# Patient Record
Sex: Male | Born: 2000 | Race: White | Hispanic: No | Marital: Single | Smoking: Never smoker
Health system: Southern US, Community
[De-identification: ages and names within clinical notes are randomized; demographics above are authoritative.]

## PROBLEM LIST (undated history)

## (undated) DIAGNOSIS — J302 Other seasonal allergic rhinitis: Secondary | ICD-10-CM

## (undated) HISTORY — PX: ADENOIDECTOMY: SUR15

## (undated) HISTORY — PX: TONSILLECTOMY: SUR1361

---
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
## 2022-04-16 ENCOUNTER — Ambulatory Visit: Payer: BC Managed Care – PPO | Admitting: Sports Medicine

## 2022-04-22 IMAGING — DX DG HIP (WITH OR WITHOUT PELVIS) 2-3V*L*
3 series · 3 of 3 positions shown · non-contrast
Comparison: None.

CLINICAL DATA: Pain and swelling at left anterior superior iliac
spine after a fall, assess for fracture. Tripped while playing
baseball 06/07/2021 landing on left hip. Pain resolved per turned 3
days ago while playing baseball.

EXAM:
DG HIP (WITH OR WITHOUT PELVIS) 2-3V LEFT

[pelvis ap]
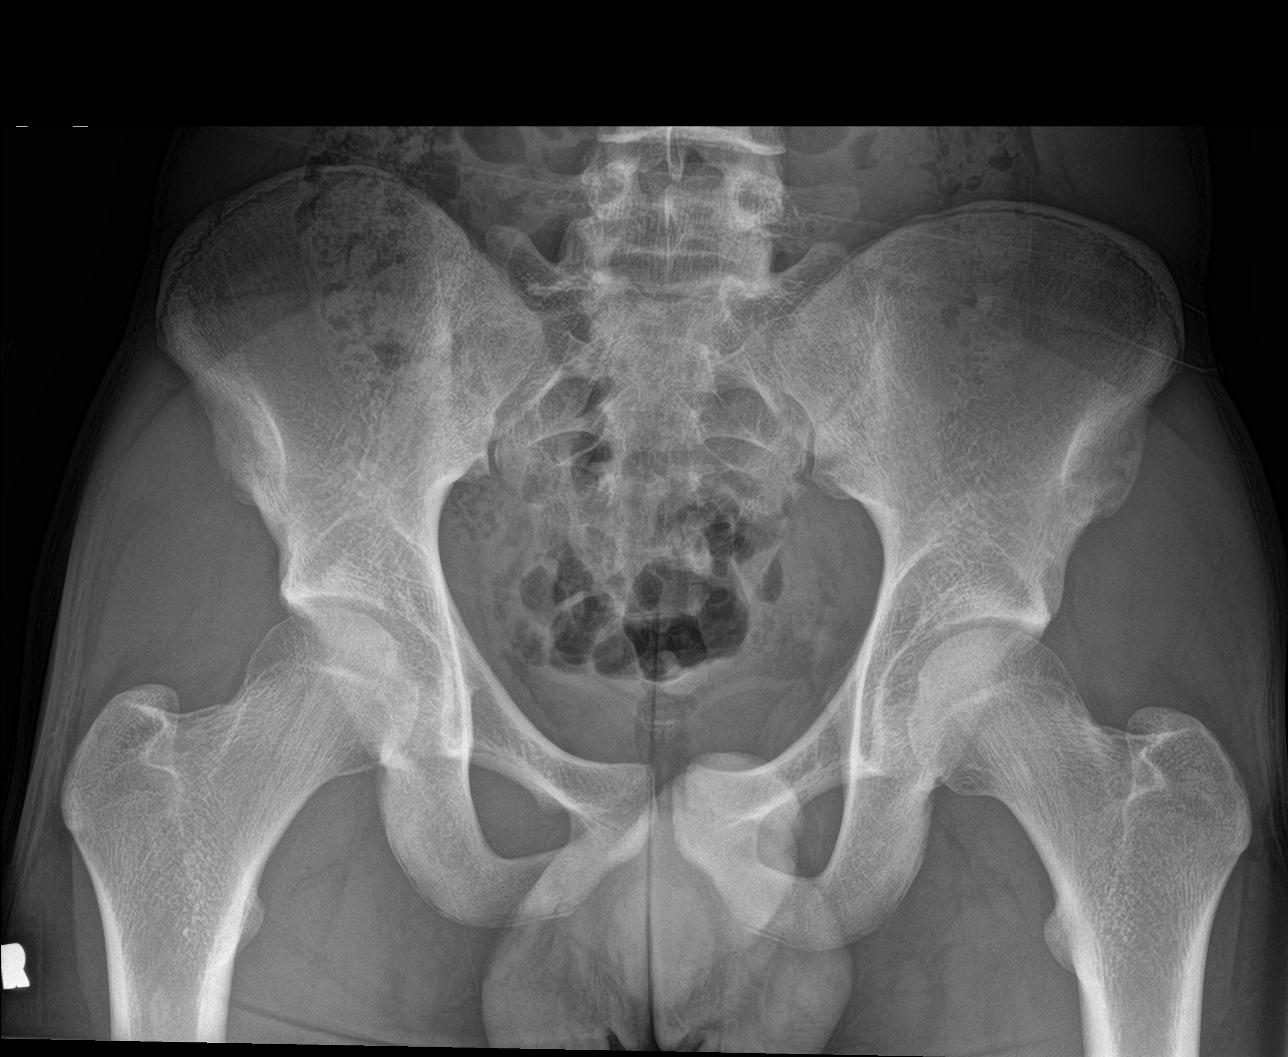

[hip ap]
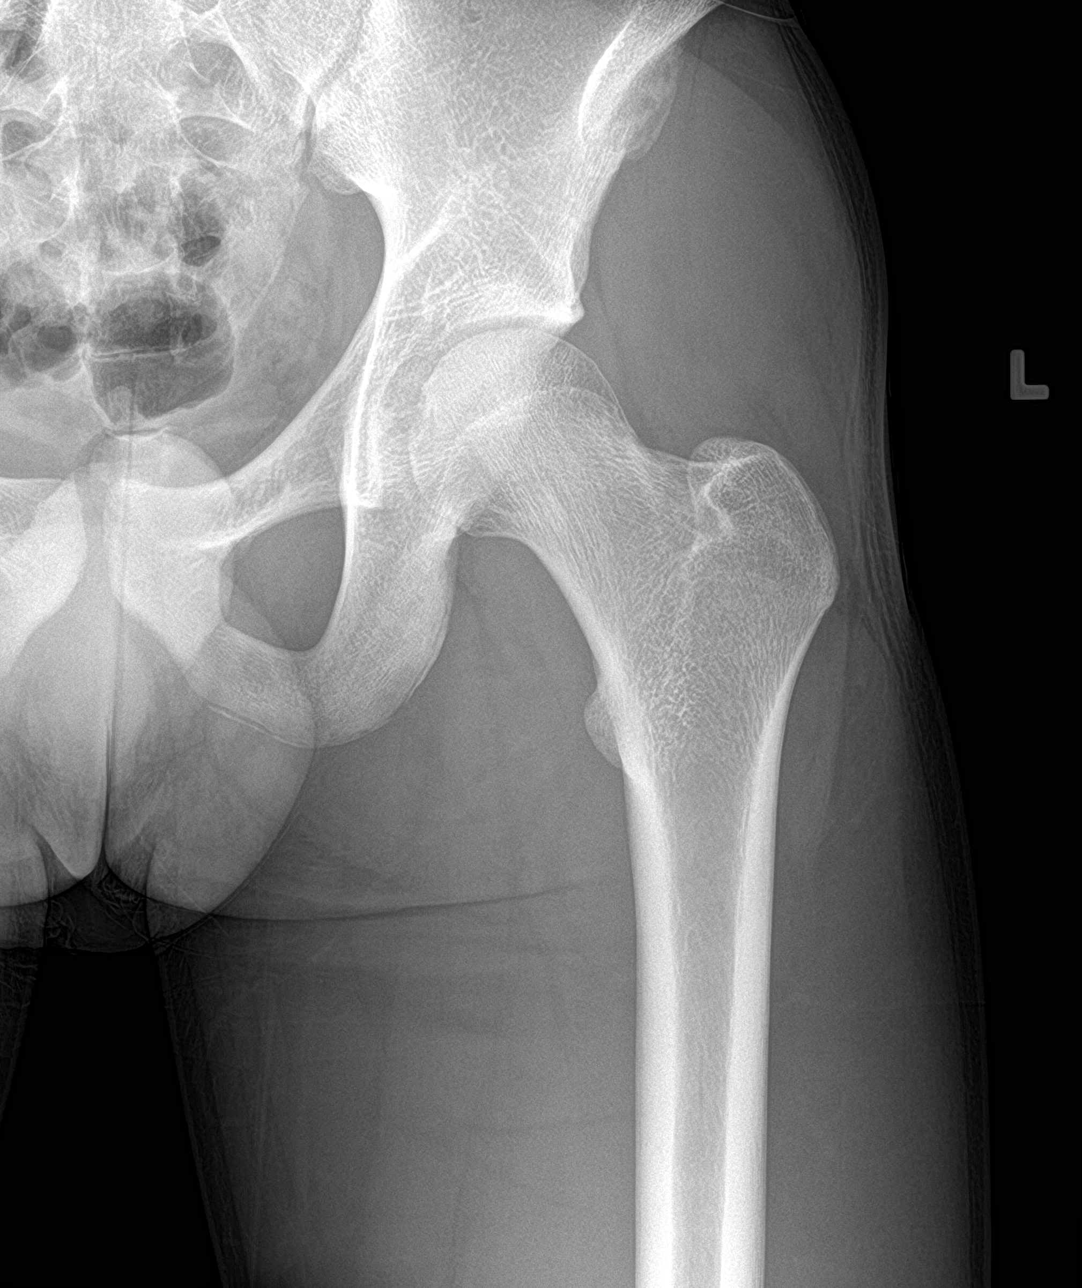

[hip lat]
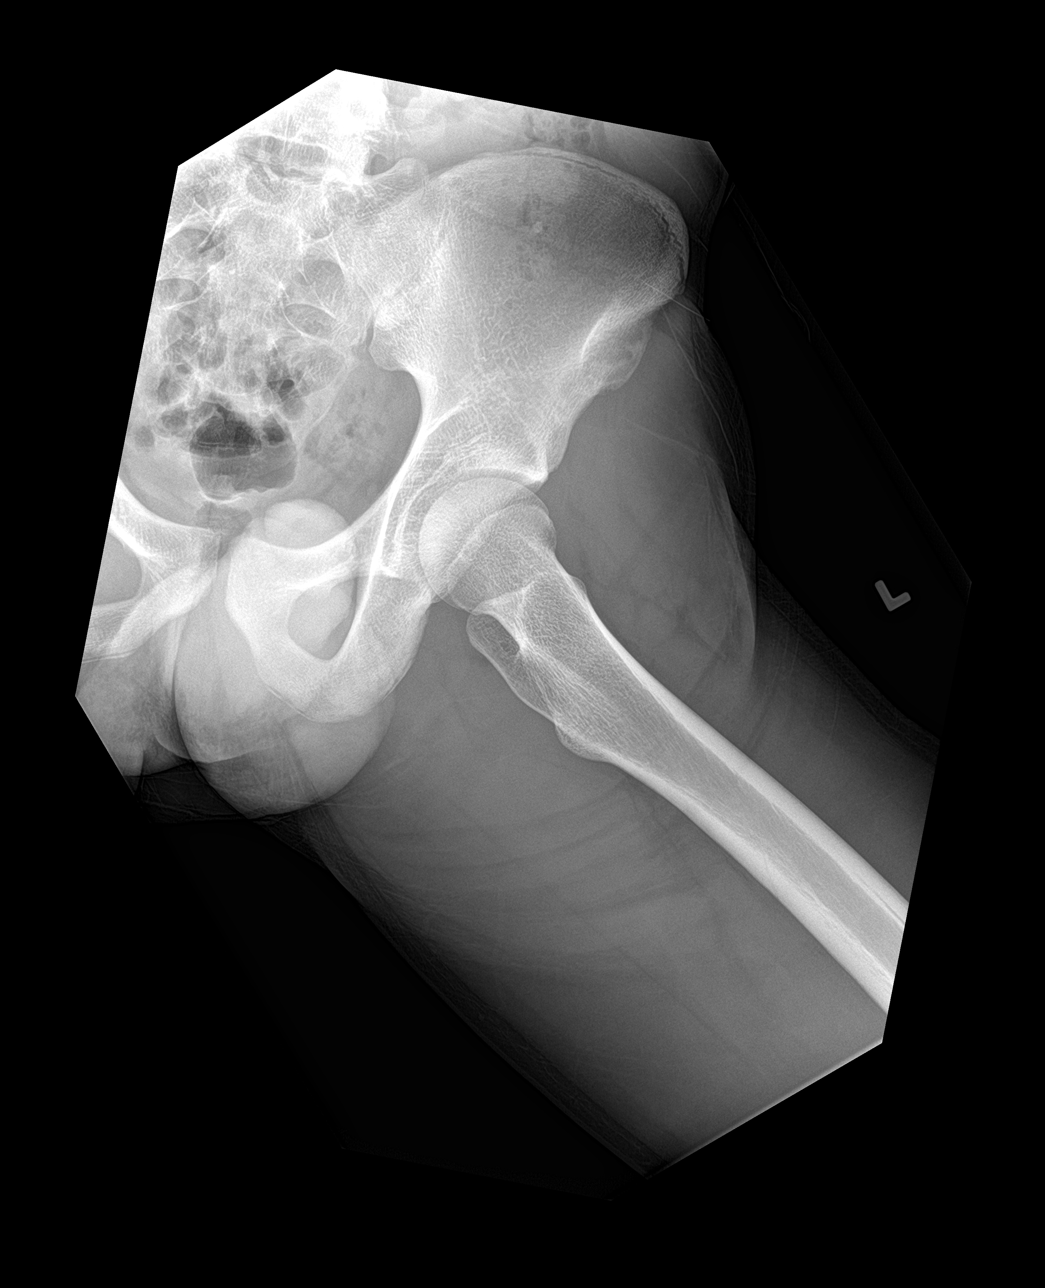

[3 of 3 positions shown; findings below may reference images not displayed]

FINDINGS: No acute fracture. Symmetric appearance of the iliac crest apophyses
without widening or displacement. The anterior superior iliac crest
apophysis ease are fusing, no definite avulsion injury or periosteal
reaction to suggest subacute fracture. Both femoral heads are well
seated in the respective acetabulum. Proximal femoral growth plates
are fused. The left hip joint space is preserved. The pubic rami and
sacroiliac joints are congruent.
IMPRESSION: No radiographic evidence of fracture of the pelvis or left hip. No
radiographic evidence of anterior superior iliac spine avulsion,
with fusing physis. If there is persistent clinical concern for
physeal injury, recommend MRI.

## 2022-06-17 ENCOUNTER — Ambulatory Visit (INDEPENDENT_AMBULATORY_CARE_PROVIDER_SITE_OTHER): Payer: BC Managed Care – PPO | Admitting: Sports Medicine

## 2022-06-17 ENCOUNTER — Encounter: Payer: Self-pay | Admitting: Sports Medicine

## 2022-06-17 VITALS — BP 113/72 | HR 70 | Ht 72.0 in | Wt 154.0 lb

## 2022-06-17 DIAGNOSIS — Z025 Encounter for examination for participation in sport: Secondary | ICD-10-CM

## 2022-06-17 DIAGNOSIS — Z130 Encounter for screening for diseases of the blood and blood-forming organs and certain disorders involving the immune mechanism: Secondary | ICD-10-CM

## 2022-06-17 NOTE — Progress Notes (Signed)
    Procedures performed today:    None.  Independent interpretation of notes and tests performed by another provider:   None.  Brief History, Exam, Impression, and Recommendations:    General: Well Developed, well nourished, and in no acute distress.  Neuro: Alert and oriented x3, extra-ocular muscles intact, sensation grossly intact. Cranial nerves II through XII are intact, motor, sensory, and coordinative functions are all intact. HEENT: Normocephalic, atraumatic, pupils equal round reactive to light, neck supple, no masses, no lymphadenopathy, thyroid nonpalpable. Oropharynx, nasopharynx, external ear canals are unremarkable. Skin: Warm and dry, no rashes noted.  Cardiac: Regular rate and rhythm, no murmurs rubs or gallops.  Respiratory: Clear to auscultation bilaterally. Not using accessory muscles, speaking in full sentences.  Abdominal: Soft, nontender, nondistended, positive bowel sounds, no masses, no organomegaly.  Musculoskeletal: Shoulder, elbow, wrist, hip, knee, ankle stable, and with full range of motion.  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 He will need a sickle test as all college athletes do.    ____________________________________________ Ihor Austin. Benjamin Stain, M.D., ABFM., CAQSM., AME. Primary Care and Sports Medicine [facility]  Adjunct Professor of Family Medicine  [employer]  Restaurant manager, fast food

## 2022-06-17 NOTE — Assessment & Plan Note (Signed)
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 He will need a sickle test as all college athletes do.

## 2022-06-18 LAB — SICKLE CELL SCREEN: Sickle Solubility Test - HGBRFX: NEGATIVE

## 2023-01-28 ENCOUNTER — Ambulatory Visit: Payer: BC Managed Care – PPO | Admitting: Sports Medicine

## 2023-01-30 ENCOUNTER — Ambulatory Visit: Payer: BC Managed Care – PPO | Admitting: Sports Medicine

## 2023-01-30 ENCOUNTER — Encounter: Payer: Self-pay | Admitting: Sports Medicine

## 2023-01-30 ENCOUNTER — Ambulatory Visit (INDEPENDENT_AMBULATORY_CARE_PROVIDER_SITE_OTHER): Payer: BC Managed Care – PPO

## 2023-01-30 DIAGNOSIS — M25521 Pain in right elbow: Secondary | ICD-10-CM

## 2023-01-30 DIAGNOSIS — R590 Localized enlarged lymph nodes: Secondary | ICD-10-CM | POA: Diagnosis not present

## 2023-01-30 MED ORDER — MELOXICAM 15 MG PO TABS: ORAL_TABLET | ORAL | 3 refills | Status: AC

## 2023-01-30 MED ORDER — AZITHROMYCIN 250 MG PO TABS: ORAL_TABLET | ORAL | 0 refills | Status: AC

## 2023-01-30 NOTE — Assessment & Plan Note (Signed)
Barry Hill is a pleasant, previously healthy 22 year old male, he had an upper respiratory infection, then about 3 weeks ago he developed swelling in his neck. All viral symptoms have improved, no constitutional symptoms. On exam he does have bilateral anterior and posterior cervical chain lymphadenopathy, well-defined, movable, nontender. I suspect this is just reactive lymphadenopathy, we will do a course of azithromycin, and I will review evaluate this in 6 weeks. If still present we will proceed with neck and soft tissue ultrasound.

## 2023-01-30 NOTE — Assessment & Plan Note (Signed)
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 For the past several months he had increasing pain anterior right elbow just proximal to the joint line. Worse after throats. His exam is for the most part unrevealing, ligamentous structures are stable, good motion. He has significant weakness to extension of the elbow. Negative moving valgus stress test. He does have a glenohumeral internal rotation deficit. Get x-rays of his elbow, I would like him to do some rotator cuff conditioning and strengthening exercises. Will do meloxicam. He does limit even so if we do need physical therapy he would likely need to do this in [place], [place]. Return to see me in 6 weeks.

## 2023-01-30 NOTE — Progress Notes (Signed)
    Procedures performed today:    None.  Independent interpretation of notes and tests performed by another provider:   None.  Brief History, Exam, Impression, and Recommendations:    Cervical lymphadenopathy Barry Hill is a pleasant, previously healthy 22 year old male, he had an upper respiratory infection, then about 3 weeks ago he developed swelling in his neck. All viral symptoms have improved, no constitutional symptoms. On exam he does have bilateral anterior and posterior cervical chain lymphadenopathy, well-defined, movable, nontender. I suspect this is just reactive lymphadenopathy, we will do a course of azithromycin, and I will review evaluate this in 6 weeks. If still present we will proceed with neck and soft tissue ultrasound.  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 For the past several months he had increasing pain anterior right elbow just proximal to the joint line. Worse after throats. His exam is for the most part unrevealing, ligamentous structures are stable, good motion. He has significant weakness to extension of the elbow. Negative moving valgus stress test. He does have a glenohumeral internal rotation deficit. Get x-rays of his elbow, I would like him to do some rotator cuff conditioning and strengthening exercises. Will do meloxicam. He does limit even so if we do need physical therapy he would likely need to do this in [place], [place]. Return to see me in 6 weeks.    ____________________________________________ Gwen Her. Dianah Field, M.D., ABFM., CAQSM., AME. Primary Care and Sports Medicine [facility]  Adjunct Professor of [employer] Medicine  Risk manager

## 2023-03-07 ENCOUNTER — Ambulatory Visit: Payer: BC Managed Care – PPO | Admitting: Sports Medicine

## 2023-03-14 ENCOUNTER — Ambulatory Visit: Payer: BC Managed Care – PPO | Admitting: Sports Medicine

## 2023-07-08 ENCOUNTER — Encounter: Payer: Self-pay | Admitting: Sports Medicine

## 2023-07-08 ENCOUNTER — Ambulatory Visit (INDEPENDENT_AMBULATORY_CARE_PROVIDER_SITE_OTHER): Payer: BC Managed Care – PPO | Admitting: Sports Medicine

## 2023-07-08 VITALS — BP 128/72 | HR 82 | Ht 72.0 in | Wt 164.0 lb

## 2023-07-08 DIAGNOSIS — R011 Cardiac murmur, unspecified: Secondary | ICD-10-CM | POA: Diagnosis not present

## 2023-07-08 DIAGNOSIS — Z025 Encounter for examination for participation in sport: Secondary | ICD-10-CM | POA: Diagnosis not present

## 2023-07-08 NOTE — Assessment & Plan Note (Signed)
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 He has already had a negative sickle test.

## 2023-07-08 NOTE — Progress Notes (Signed)
Subjective:    CC: Annual Physical Exam  HPI:  This patient is here for their annual physical  I reviewed the past medical history, family history, social history, surgical history, and allergies today and no changes were needed.  Please see the problem list section below in epic for further details.  Past Medical History: Past Medical History:  Diagnosis Date   Seasonal allergies    Past Surgical History: Past Surgical History:  Procedure Laterality Date   ADENOIDECTOMY     TONSILLECTOMY     Social History: Social History   Socioeconomic History   Marital status: Single    Spouse name: Not on file   Number of children: Not on file   Years of education: Not on file   Highest education level: Not on file  Occupational History   Not on file  Tobacco Use   Smoking status: Never   Smokeless tobacco: Never  Substance and Sexual Activity   Alcohol use: No   Drug use: No   Sexual activity: Not on file  Other Topics Concern   Not on file  Social History Narrative   Not on file   Social Determinants of Health   Financial Resource Strain: Not on file  Food Insecurity: Patient Declined (03/15/2022)   Received from [facility], [facility]   Hunger Vital Sign    Worried About Running Out of Food in the Last Year: Patient declined    Ran Out of Food in the Last Year: Patient declined  Transportation Needs: Not on file  Physical Activity: Not on file  Stress: Not on file  Social Connections: Unknown (04/12/2022)   Received from [facility], [facility]   Social Network    Social Network: Not on file   Family History: Family History  Problem Relation Age of Onset   Hashimoto's thyroiditis Mother    Ulcerative colitis Father    Allergies: No Known Allergies Medications: See med rec.  Review of Systems: No headache, visual changes, nausea, vomiting, diarrhea, constipation, dizziness, abdominal pain, skin rash, fevers, chills, night sweats, swollen lymph  nodes, weight loss, chest pain, body aches, joint swelling, muscle aches, shortness of breath, mood changes, visual or auditory hallucinations.  Objective:    General: Well Developed, well nourished, and in no acute distress.  Neuro: Alert and oriented x3, extra-ocular muscles intact, sensation grossly intact. Cranial nerves II through XII are intact, motor, sensory, and coordinative functions are all intact. HEENT: Normocephalic, atraumatic, pupils equal round reactive to light, neck supple, no masses, no lymphadenopathy, thyroid nonpalpable. Oropharynx, nasopharynx, external ear canals are unremarkable. Skin: Warm and dry, no rashes noted.  Cardiac: Regular rate and rhythm, no murmurs rubs or gallops.  Respiratory: Clear to auscultation bilaterally. Not using accessory muscles, speaking in full sentences.  Abdominal: Soft, nontender, nondistended, positive bowel sounds, no masses, no organomegaly.  Musculoskeletal: Shoulder, elbow, wrist, hip, knee, ankle stable, and with full range of motion.  Impression and Recommendations:    The patient was counselled, risk factors were discussed, anticipatory guidance given.  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 He has already had a negative sickle test.   Systolic murmur Suspected a stills murmur, recent echo was normal, I do not hear the murmur today, cleared for all athletic participation.   ____________________________________________ Ihor Austin. Benjamin Stain, M.D., ABFM., CAQSM., AME. Primary Care and Sports Medicine [facility]  Adjunct Professor of Family Medicine  [employer]  Restaurant manager, [employer] food

## 2023-07-08 NOTE — Assessment & Plan Note (Signed)
Suspected a stills murmur, recent echo was normal, I do not hear the murmur today, cleared for all athletic participation.

## 2024-07-08 ENCOUNTER — Encounter: Payer: BC Managed Care – PPO | Admitting: Sports Medicine

## 2024-08-03 ENCOUNTER — Encounter: Payer: Self-pay | Admitting: Sports Medicine
